# Patient Record
Sex: Male | Born: 1965 | Race: Black or African American | Hispanic: No | Marital: Single | Smoking: Current every day smoker
Health system: Southern US, Community
[De-identification: ages and names within clinical notes are randomized; demographics above are authoritative.]

## PROBLEM LIST (undated history)

## (undated) DIAGNOSIS — J45909 Unspecified asthma, uncomplicated: Secondary | ICD-10-CM

---

## 2016-07-12 ENCOUNTER — Emergency Department (HOSPITAL_COMMUNITY)
Admission: EM | Admit: 2016-07-12 | Discharge: 2016-07-12 | Disposition: A | Discharge status: Home / Self Care | Payer: Self-pay | Attending: Emergency Medicine | Admitting: Emergency Medicine

## 2016-07-12 ENCOUNTER — Emergency Department (HOSPITAL_COMMUNITY): Payer: Self-pay

## 2016-07-12 ENCOUNTER — Encounter (HOSPITAL_COMMUNITY): Payer: Self-pay

## 2016-07-12 DIAGNOSIS — M549 Dorsalgia, unspecified: Secondary | ICD-10-CM | POA: Insufficient documentation

## 2016-07-12 DIAGNOSIS — W1839XA Other fall on same level, initial encounter: Secondary | ICD-10-CM | POA: Insufficient documentation

## 2016-07-12 DIAGNOSIS — Y9289 Other specified places as the place of occurrence of the external cause: Secondary | ICD-10-CM | POA: Insufficient documentation

## 2016-07-12 DIAGNOSIS — J45909 Unspecified asthma, uncomplicated: Secondary | ICD-10-CM | POA: Insufficient documentation

## 2016-07-12 DIAGNOSIS — Y999 Unspecified external cause status: Secondary | ICD-10-CM | POA: Insufficient documentation

## 2016-07-12 DIAGNOSIS — F17200 Nicotine dependence, unspecified, uncomplicated: Secondary | ICD-10-CM | POA: Insufficient documentation

## 2016-07-12 DIAGNOSIS — M542 Cervicalgia: Secondary | ICD-10-CM | POA: Insufficient documentation

## 2016-07-12 DIAGNOSIS — W19XXXA Unspecified fall, initial encounter: Secondary | ICD-10-CM

## 2016-07-12 DIAGNOSIS — Y9389 Activity, other specified: Secondary | ICD-10-CM | POA: Insufficient documentation

## 2016-07-12 HISTORY — DX: Unspecified asthma, uncomplicated: J45.909

## 2016-07-12 MED ORDER — IBUPROFEN 600 MG PO TABS: 600.0000 mg | ORAL_TABLET | Freq: Three times a day (TID) | ORAL | 0 refills | Status: AC | PRN

## 2016-07-12 MED ORDER — MORPHINE SULFATE (PF) 4 MG/ML IV SOLN
4.0000 mg | Freq: Once | INTRAVENOUS | Status: AC
  Administered 2016-07-12: 4 mg via INTRAVENOUS
  Filled 2016-07-12: qty 1

## 2016-07-12 MED ORDER — CYCLOBENZAPRINE HCL 10 MG PO TABS: 10.0000 mg | ORAL_TABLET | Freq: Three times a day (TID) | ORAL | 0 refills | Status: AC | PRN

## 2016-07-12 NOTE — ED Triage Notes (Signed)
Per pt, was on 2nd floor of a building in a window installing AC.  Pt fell backwards landing on his back.  Pt c/o back and neck pain. Denies LOC, numbness or tingling.  MD at bedside.

## 2016-07-12 NOTE — ED Provider Notes (Signed)
[facility]-EMERGENCY DEPT Provider Note   CSN: [id number] Arrival date & time: 07/12/16  [id number]     History   Chief Complaint Chief Complaint  Patient presents with  . Back Pain  . Fall    HPI Tim Brown is a 50 y.o. male.  HPI Patient reports falling backwards off a second story today when removing an air conditioner.  He states he landed on his back.  He presents with neck and back pain.  He denies weakness of his upper lower extremity as.  He did not hit his head.  He denies loss consciousness.  No use of anticoagulants.  No numbness, weakness, tingling of his extremities.  Denies abdominal pain.  No headache at this time.  Pain in his back is moderate in severity   Past Medical History:  Diagnosis Date  . Asthma     There are no active problems to display for this patient.   History reviewed. No pertinent surgical history.     Home Medications    Prior to Admission medications   Medication Sig Start Date End Date Taking? Authorizing Provider  cyclobenzaprine (FLEXERIL) 10 MG tablet Take 1 tablet (10 mg total) by mouth 3 (three) times daily as needed for muscle spasms. 07/12/16   Azalia BilisKevin Anvay Tennis, MD  ibuprofen (ADVIL,MOTRIN) 600 MG tablet Take 1 tablet (600 mg total) by mouth every 8 (eight) hours as needed. 07/12/16   Azalia BilisKevin Duwan Adrian, MD    Family History History reviewed. No pertinent family history.  Social History Social History  Substance Use Topics  . Smoking status: Current Every Day Smoker  . Smokeless tobacco: Never Used  . Alcohol use No     Allergies   Patient has no known allergies.   Review of Systems Review of Systems  All other systems reviewed and are negative.    Physical Exam Updated Vital Signs BP 149/97 (BP Location: Right Arm)   Pulse 78   Temp 98.1 F (36.7 C) (Oral)   Resp 13   Ht 5\' 6"  (1.676 m)   Wt 155 lb (70.3 kg)   SpO2 100%   BMI 25.02 kg/m   Physical Exam  Constitutional: He is oriented to person, place, and time. He  appears well-developed and well-nourished.  HENT:  Head: Normocephalic and atraumatic.  Eyes: EOM are normal.  Neck:  Immobilized in cervical collar.  Mild cervical and paracervical tenderness without cervical step-off.  Cardiovascular: Normal rate, regular rhythm and normal heart sounds.   Pulmonary/Chest: Effort normal and breath sounds normal. No respiratory distress.  Abdominal: Soft. He exhibits no distension. There is no tenderness.  Musculoskeletal: Normal range of motion.  5 out of 5 strength in bilateral upper lower extremity major muscle groups.  Normal grip strength bilaterally.  Full range of motion of bilateral upper lower extremity major joints.  No obvious extremity deformities.  No lumbar or paralumbar tenderness.  Mild thoracic and parathoracic tenderness without spasm.  Neurological: He is alert and oriented to person, place, and time.  Skin: Skin is warm and dry.  Psychiatric: He has a normal mood and affect. Judgment normal.  Nursing note and vitals reviewed.    ED Treatments / Results  Labs (all labs ordered are listed, but only abnormal results are displayed) Labs Reviewed - No data to display  EKG  EKG Interpretation None       Radiology Dg Chest 2 View  Result Date: 07/12/2016 CLINICAL DATA:  Status post fall.  Neck pain. EXAM: CHEST  2  VIEW COMPARISON:  None. FINDINGS: The heart size and mediastinal contours are within normal limits. Both lungs are clear. The visualized skeletal structures are unremarkable. IMPRESSION: No active cardiopulmonary disease. Electronically Signed   By: Elige KoHetal  Patel   On: 07/12/2016 10:59   Dg Cervical Spine Complete  Result Date: 07/12/2016 CLINICAL DATA:  Fall.  Neck pain EXAM: CERVICAL SPINE - COMPLETE 4+ VIEW COMPARISON:  None. FINDINGS: Normal alignment. Negative for fracture. Mild disc degeneration and spurring at C4-5 and C5-6. Moderate spurring at C6-7 causing foraminal encroachment bilaterally of a moderate degree.  IMPRESSION: Negative for fracture.  Cervical spondylosis Electronically Signed   By: Marlan Palauharles  Clark M.D.   On: 07/12/2016 11:01   Dg Thoracic Spine 2 View  Result Date: 07/12/2016 CLINICAL DATA:  Fall.  Back pain EXAM: THORACIC SPINE 2 VIEWS COMPARISON:  None FINDINGS: Normal alignment. Negative for fracture. No bony lesion. Mild disc degeneration and spurring throughout the thoracic spine. IMPRESSION: Negative for fracture. Electronically Signed   By: Marlan Palauharles  Clark M.D.   On: 07/12/2016 11:01    Procedures Procedures (including critical care time)  Medications Ordered in ED Medications  morphine 4 MG/ML injection 4 mg (4 mg Intravenous Given 07/12/16 1022)     Initial Impression / Assessment and Plan / ED Course  I have reviewed the triage vital signs and the nursing notes.  Pertinent labs & imaging results that were available during my care of the patient were reviewed by me and considered in my medical decision making (see chart for details).  Clinical Course     Chest x-ray, cervical spine x-ray, thoracic spine x-ray without acute fracture.  Repeat abdominal exam is without tenderness.  He has no neurologic symptoms at this time.  Primary care follow-up.  He understands return to the ER for new or worsening symptoms.  Home with ibuprofen and muscle relaxants  Final Clinical Impressions(s) / ED Diagnoses   Final diagnoses:  Fall, initial encounter  Acute back pain, unspecified back location, unspecified back pain laterality    New Prescriptions New Prescriptions   CYCLOBENZAPRINE (FLEXERIL) 10 MG TABLET    Take 1 tablet (10 mg total) by mouth 3 (three) times daily as needed for muscle spasms.   IBUPROFEN (ADVIL,MOTRIN) 600 MG TABLET    Take 1 tablet (600 mg total) by mouth every 8 (eight) hours as needed.     Azalia BilisKevin Taylr Meuth, MD 07/12/16 [phone]

## 2018-04-10 IMAGING — CR DG CERVICAL SPINE COMPLETE 4+V
6 series · 6 of 6 positions shown · non-contrast
Comparison: None.

CLINICAL DATA: Fall.  Neck pain

EXAM:
CERVICAL SPINE - COMPLETE 4+ VIEW

[w cervical spine lat]
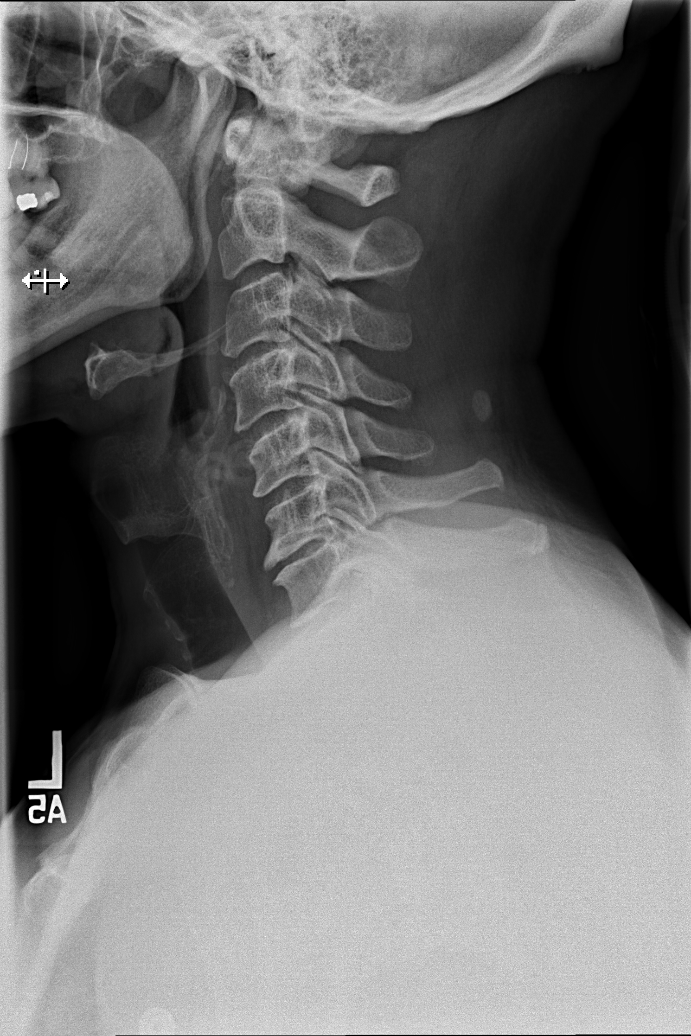

[w cervical spine ap_obl (1 of 2)]
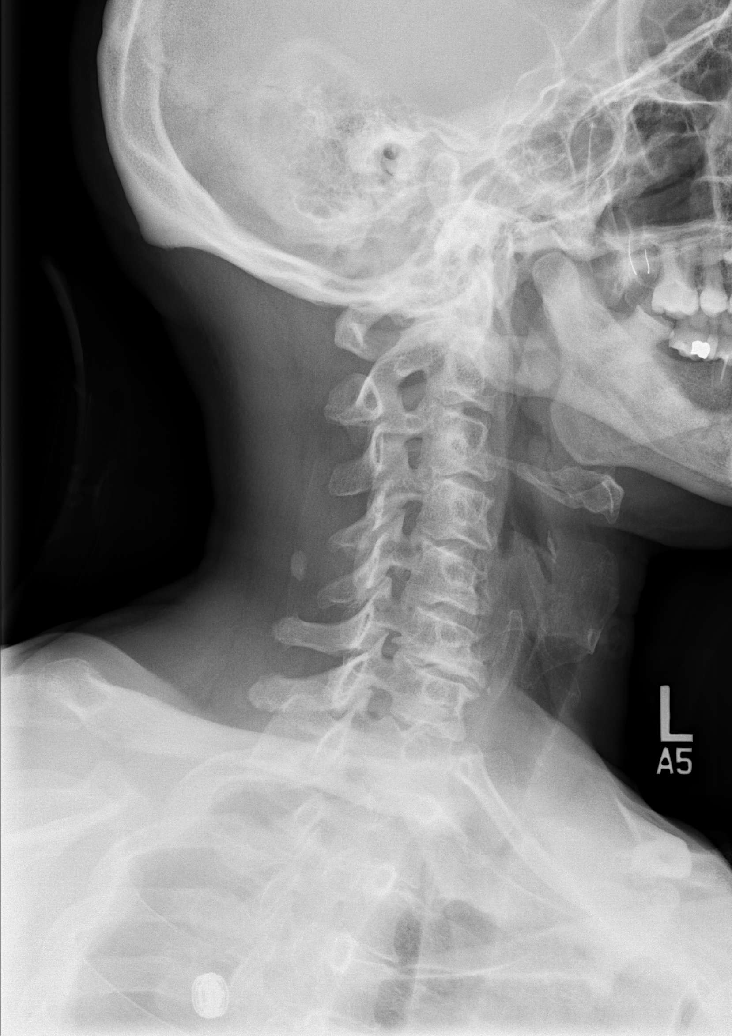

[w cervical spine ap_obl (2 of 2)]
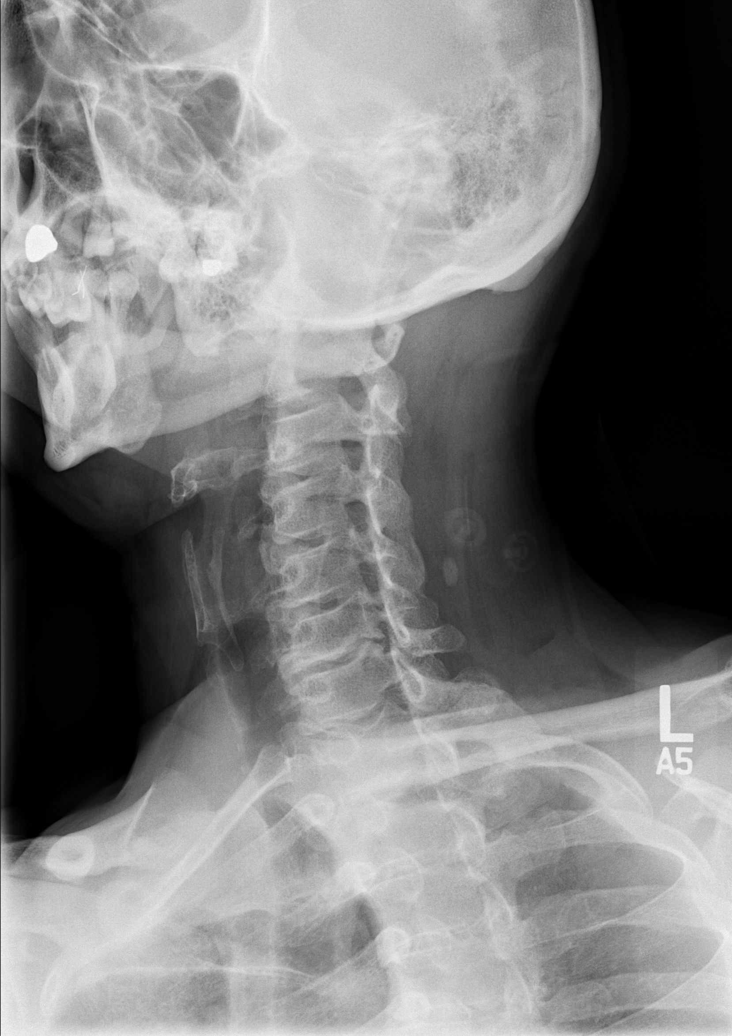

[w cervical spine ap]
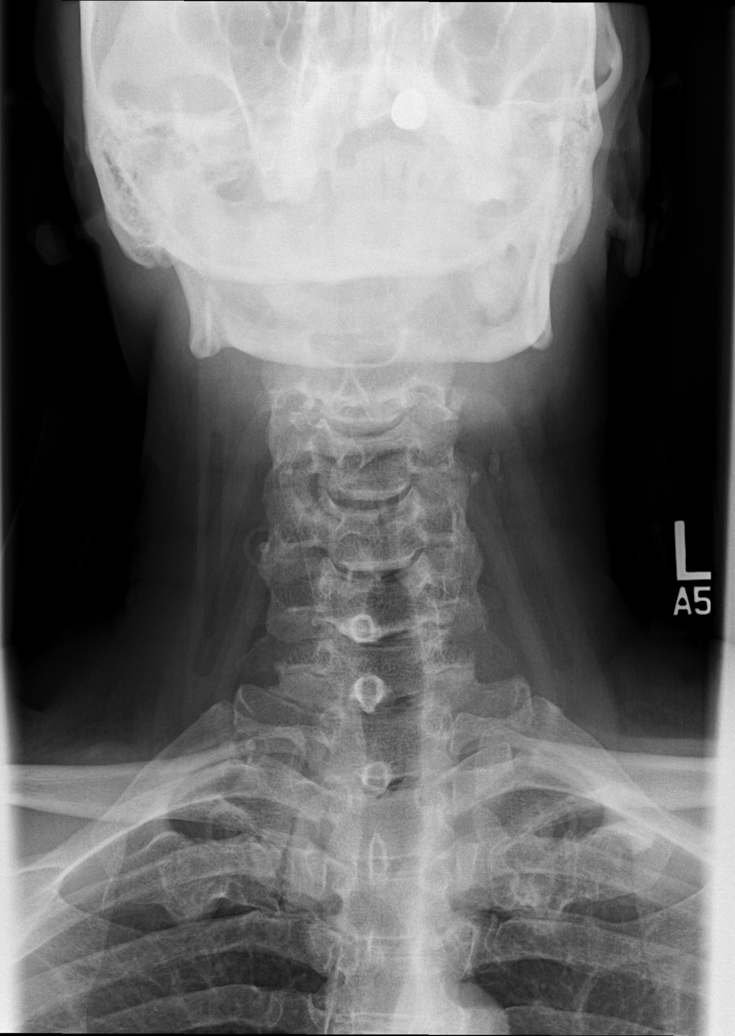

[w cervical spine odontoid (1 of 2)]
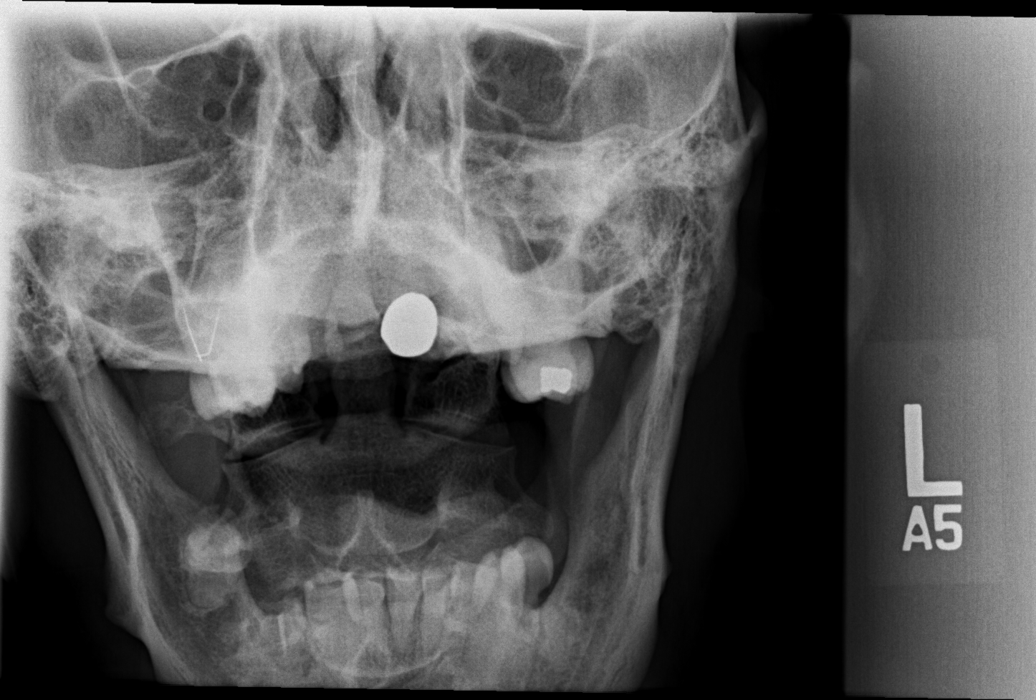

[w cervical spine odontoid (2 of 2)]
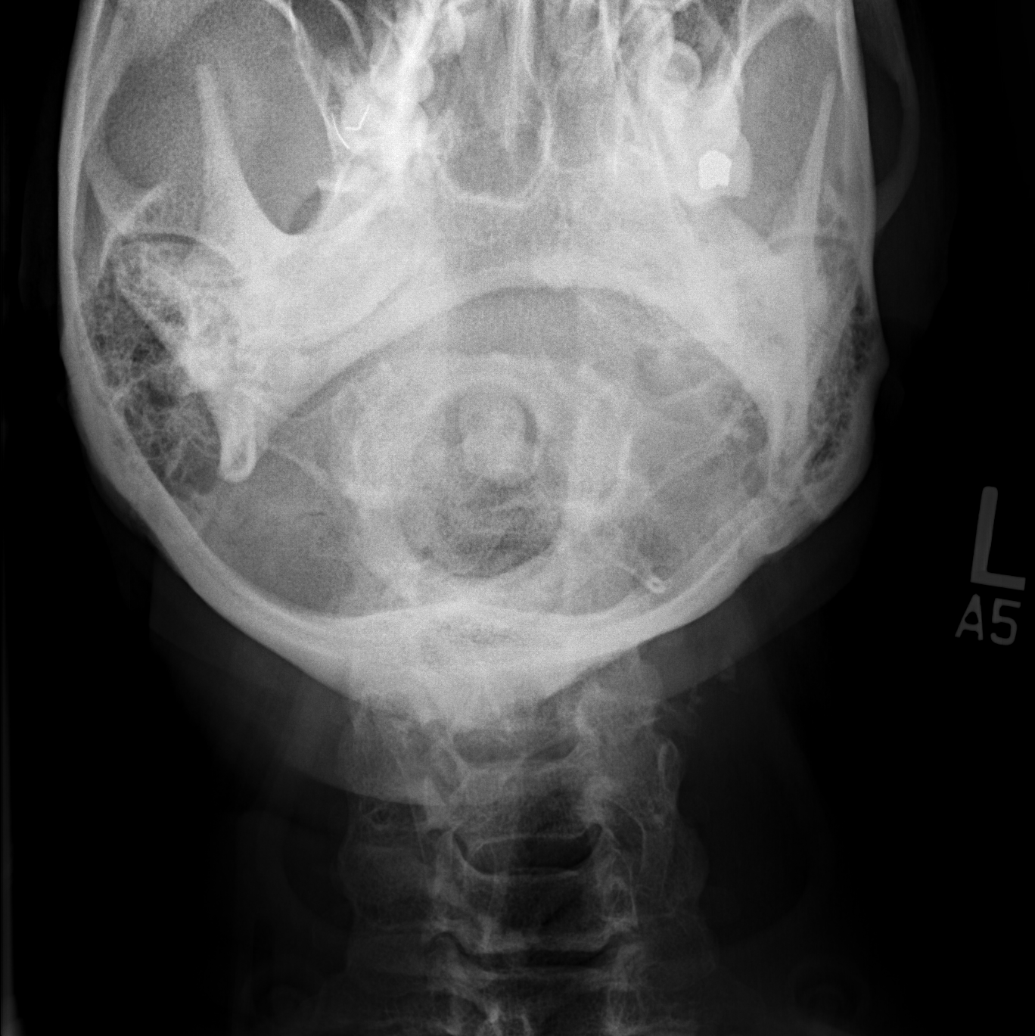

[6 of 6 positions shown; findings below may reference images not displayed]

FINDINGS: Normal alignment. Negative for fracture. Mild disc degeneration and
spurring at C4-5 and C5-6. Moderate spurring at C6-7 causing
foraminal encroachment bilaterally of a moderate degree.
IMPRESSION: Negative for fracture.  Cervical spondylosis
